# Patient Record
Sex: Male | Born: 2005 | Race: White | Hispanic: No | Marital: Single | Smoking: Never smoker
Health system: Southern US, Community
[De-identification: ages and names within clinical notes are randomized; demographics above are authoritative.]

## PROBLEM LIST (undated history)

## (undated) DIAGNOSIS — R488 Other symbolic dysfunctions: Secondary | ICD-10-CM

## (undated) DIAGNOSIS — F909 Attention-deficit hyperactivity disorder, unspecified type: Secondary | ICD-10-CM

## (undated) DIAGNOSIS — R6250 Unspecified lack of expected normal physiological development in childhood: Secondary | ICD-10-CM

## (undated) DIAGNOSIS — R278 Other lack of coordination: Secondary | ICD-10-CM

## (undated) HISTORY — DX: Other symbolic dysfunctions: R48.8

## (undated) HISTORY — DX: Unspecified lack of expected normal physiological development in childhood: R62.50

## (undated) HISTORY — DX: Attention-deficit hyperactivity disorder, unspecified type: F90.9

## (undated) HISTORY — DX: Other lack of coordination: R27.8

---
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
## 2024-03-21 ENCOUNTER — Other Ambulatory Visit: Payer: Self-pay

## 2024-03-21 ENCOUNTER — Encounter (HOSPITAL_COMMUNITY): Payer: Self-pay

## 2024-03-21 ENCOUNTER — Emergency Department (HOSPITAL_COMMUNITY)

## 2024-03-21 ENCOUNTER — Emergency Department (HOSPITAL_COMMUNITY)
Admission: EM | Admit: 2024-03-21 | Discharge: 2024-03-21 | Disposition: A | Discharge status: Home / Self Care | Attending: Emergency Medicine | Admitting: Emergency Medicine

## 2024-03-21 DIAGNOSIS — S61431A Puncture wound without foreign body of right hand, initial encounter: Secondary | ICD-10-CM

## 2024-03-21 DIAGNOSIS — W3400XA Accidental discharge from unspecified firearms or gun, initial encounter: Secondary | ICD-10-CM | POA: Diagnosis not present

## 2024-03-21 DIAGNOSIS — S61401A Unspecified open wound of right hand, initial encounter: Secondary | ICD-10-CM | POA: Diagnosis present

## 2024-03-21 MED ORDER — OXYCODONE-ACETAMINOPHEN 5-325 MG PO TABS: 1.0000 | ORAL_TABLET | Freq: Four times a day (QID) | ORAL | 0 refills | Status: AC | PRN

## 2024-03-21 MED ORDER — CEPHALEXIN 500 MG PO CAPS: 500.0000 mg | ORAL_CAPSULE | Freq: Four times a day (QID) | ORAL | 0 refills | Status: DC

## 2024-03-21 NOTE — ED Notes (Signed)
Ortho tech contacted for splint application
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
## 2024-03-21 NOTE — Discharge Instructions (Addendum)
 Take antibiotics.  Take the pain meds as needed.  Follow-up with Dr. Camella or Dr. Alyse.

## 2024-03-21 NOTE — Progress Notes (Signed)
 Orthopedic Tech Progress Note Patient Details:  Samuel Jefferson 04/27/2005 [id number]  Ortho Devices Type of Ortho Device: Ulna gutter splint Ortho Device/Splint Location: rue Ortho Device/Splint Interventions: Ordered, Application, Adjustment  Applied without bend to fingers because splint is just for immobilization. Post Interventions Patient Tolerated: Well Instructions Provided: Care of device, Adjustment of device  Chandra Dorn PARAS 03/21/2024, 11:02 PM
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
## 2024-03-22 ENCOUNTER — Telehealth (HOSPITAL_COMMUNITY): Payer: Self-pay | Admitting: Emergency Medicine

## 2024-03-22 MED ORDER — CEPHALEXIN 500 MG PO CAPS: 500.0000 mg | ORAL_CAPSULE | Freq: Four times a day (QID) | ORAL | 0 refills | Status: AC

## 2024-03-22 NOTE — Telephone Encounter (Signed)
 Patient requested antibiotic to be resent to pharmacy.
